# Patient Record
Sex: Male | Born: 1980 | Race: Black or African American | Hispanic: No | Marital: Single | Smoking: Current some day smoker
Health system: Southern US, Community
[De-identification: ages and names within clinical notes are randomized; demographics above are authoritative.]

## PROBLEM LIST (undated history)

## (undated) DIAGNOSIS — Z789 Other specified health status: Secondary | ICD-10-CM

## (undated) HISTORY — PX: ROTATOR CUFF REPAIR: SHX139

---

## 2018-01-02 ENCOUNTER — Encounter (HOSPITAL_COMMUNITY): Payer: Self-pay

## 2018-01-02 ENCOUNTER — Other Ambulatory Visit: Payer: Self-pay

## 2018-01-02 ENCOUNTER — Emergency Department (HOSPITAL_COMMUNITY)
Admission: EM | Admit: 2018-01-02 | Discharge: 2018-01-03 | Disposition: A | Discharge status: Home / Self Care | Payer: Non-veteran care | Attending: Emergency Medicine | Admitting: Emergency Medicine

## 2018-01-02 DIAGNOSIS — W268XXA Contact with other sharp object(s), not elsewhere classified, initial encounter: Secondary | ICD-10-CM | POA: Insufficient documentation

## 2018-01-02 DIAGNOSIS — Y990 Civilian activity done for income or pay: Secondary | ICD-10-CM | POA: Insufficient documentation

## 2018-01-02 DIAGNOSIS — S61012A Laceration without foreign body of left thumb without damage to nail, initial encounter: Secondary | ICD-10-CM | POA: Diagnosis present

## 2018-01-02 DIAGNOSIS — Y929 Unspecified place or not applicable: Secondary | ICD-10-CM | POA: Diagnosis not present

## 2018-01-02 DIAGNOSIS — F17200 Nicotine dependence, unspecified, uncomplicated: Secondary | ICD-10-CM | POA: Insufficient documentation

## 2018-01-02 DIAGNOSIS — Y9389 Activity, other specified: Secondary | ICD-10-CM | POA: Insufficient documentation

## 2018-01-02 HISTORY — DX: Other specified health status: Z78.9

## 2018-01-02 NOTE — ED Triage Notes (Signed)
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 This occurred last night.

## 2018-01-03 ENCOUNTER — Emergency Department (HOSPITAL_COMMUNITY): Payer: Non-veteran care

## 2018-01-03 MED ORDER — CEPHALEXIN 500 MG PO CAPS: 500.0000 mg | ORAL_CAPSULE | Freq: Four times a day (QID) | ORAL | 0 refills | Status: AC

## 2018-01-03 MED ORDER — LIDOCAINE HCL (PF) 1 % IJ SOLN
30.0000 mL | Freq: Once | INTRAMUSCULAR | Status: AC
  Administered 2018-01-03: 30 mL
  Filled 2018-01-03: qty 30

## 2018-01-03 NOTE — ED Provider Notes (Signed)
[facility] Provider Note   CSN: [id number] Arrival date & time: 01/02/18  2259     History   Chief Complaint Chief Complaint  Patient presents with  . Laceration    HPI Leon Hernandez is a 37 y.o. male with a hx of no known medical problems presents to the Emergency Department complaining of acute, persistent, laceration to the left thumb onset > 24 hours ago.  Pt reports he was at work when he cut his thumb on a piece of metal.  He reports clean the wound with alcohol and using direct pressure.  He states the bleeding decreased but did not cease.  He then applied Dermabond x2.  Patient reports that the wound has continued to ooze blood and clear fluid.  He denies numbness, tingling or weakness in the thumb.  No history of immunocompromise, diabetes or HIV.  No redness or purulent drainage.  Movement causes increased bleeding.  Direct pressure decreases the bleeding.   The history is provided by the patient and medical records. No language interpreter was used.    Past Medical History:  Diagnosis Date  . Known health problems: none     There are no active problems to display for this patient.   Past Surgical History:  Procedure Laterality Date  . ROTATOR CUFF REPAIR     right shoulder        Home Medications    Prior to Admission medications   Medication Sig Start Date End Date Taking? Authorizing Provider  cephALEXin (KEFLEX) 500 MG capsule Take 1 capsule (500 mg total) by mouth 4 (four) times daily. 01/03/18   Ammiel Guiney, Dahlia ClientHannah, PA-C    Family History History reviewed. No pertinent family history.  Social History Social History   Tobacco Use  . Smoking status: Current Some Day Smoker  . Smokeless tobacco: Never Used  Substance Use Topics  . Alcohol use: Yes    Comment: 2 beers/week  . Drug use: Never     Allergies   Patient has no known allergies.   Review of Systems Review of Systems  Constitutional:  Negative for fever.  Gastrointestinal: Negative for nausea and vomiting.  Skin: Positive for wound.  Allergic/Immunologic: Negative for immunocompromised state.  Neurological: Negative for weakness and numbness.  Hematological: Does not bruise/bleed easily.  Psychiatric/Behavioral: The patient is not nervous/anxious.      Physical Exam Updated Vital Signs BP (!) 148/93 (BP Location: Right Arm)   Pulse 65   Temp 98.2 F (36.8 C) (Oral)   Resp 18   Ht 5\' 9"  (1.753 m)   Wt 82.6 kg   SpO2 100%   BMI 26.88 kg/m   Physical Exam  Constitutional: He is oriented to person, place, and time. He appears well-developed and well-nourished. No distress.  HENT:  Head: Normocephalic and atraumatic.  Eyes: Conjunctivae are normal. No scleral icterus.  Neck: Normal range of motion.  Cardiovascular: Normal rate, regular rhythm, normal heart sounds and intact distal pulses.  No murmur heard. Capillary refill < 3 sec  Pulmonary/Chest: Effort normal and breath sounds normal. No respiratory distress.  Musculoskeletal: Normal range of motion. He exhibits no edema.  Full range of motion of the left thumb  Neurological: He is alert and oriented to person, place, and time.  Sensation: Intact to normal touch in the left thumb Strength: 5/5 with flexion and extension the left thumb  Skin: Skin is warm and dry. He is not diaphoretic.  2 cm laceration to  the lateral portion of the left thumb over the DIP.  No open joint.  Psychiatric: He has a normal mood and affect.  Nursing note and vitals reviewed.    ED Treatments / Results   Radiology Dg Finger Thumb Left  Result Date: 01/03/2018 CLINICAL DATA:  37 y/o  M; distal left thumb laceration. EXAM: LEFT THUMB 2+V COMPARISON:  None. FINDINGS: There is no evidence of fracture or dislocation. There is no evidence of arthropathy or other focal bone abnormality. No radiopaque foreign body. IMPRESSION: 1.  No acute fracture or dislocation identified. 2. No  radiopaque foreign body. Electronically Signed   By: Mitzi Hansen M.D.   On: 01/03/2018 00:18    Procedures Debridement Date/Time: 01/03/2018 3:26 AM Performed by: Dierdre Forth, PA-C Authorized by: Dierdre Forth, PA-C  Consent: Verbal consent obtained. Risks and benefits: risks, benefits and alternatives were discussed Consent given by: patient Required items: required blood products, implants, devices, and special equipment available Patient identity confirmed: verbally with patient Time out: Immediately prior to procedure a "time out" was called to verify the correct patient, procedure, equipment, support staff and site/side marked as required. Preparation: Patient was prepped and draped in the usual sterile fashion. Local anesthesia used: yes Anesthesia: nerve block  Anesthesia: Local anesthesia used: yes Local Anesthetic: lidocaine 1% without epinephrine Anesthetic total: 5 mL  Sedation: Patient sedated: no  Patient tolerance: Patient tolerated the procedure well with no immediate complications    (including critical care time)  Medications Ordered in ED Medications  lidocaine (PF) (XYLOCAINE) 1 % injection 30 mL (30 mLs Infiltration Given by Other 01/03/18 0231)     Initial Impression / Assessment and Plan / ED Course  I have reviewed the triage vital signs and the nursing notes.  Pertinent labs & imaging results that were available during my care of the patient were reviewed by me and considered in my medical decision making (see chart for details).     Laceration to the left thumb.  Tetanus is up-to-date.  Nerve block given for pain control and wound debrided along with removal of Dermabond.  Pressure dressing applied.  Granulation tissue is visible and wound appears to be healing well.  No signs of immediate infection.  Patient will be discharged home with Keflex.  Wound care given.  Patient will have wound check in 2 days.  He states  understanding and is in agreement with this plan.  Final Clinical Impressions(s) / ED Diagnoses   Final diagnoses:  Laceration of left thumb without foreign body without damage to nail, initial encounter    ED Discharge Orders         Ordered    cephALEXin (KEFLEX) 500 MG capsule  4 times daily     01/03/18 0328           Maesyn Frisinger, Dahlia Client, PA-C 01/03/18 0331    Zadie Rhine, MD 01/04/18 [phone]

## 2018-01-03 NOTE — ED Notes (Signed)
ED Provider at bedside administering lidocaine.  Wound soaking.

## 2018-01-03 NOTE — Discharge Instructions (Addendum)
1. Medications: Tylenol or ibuprofen for pain, usual home medications 2. Treatment: ice for swelling, keep wound clean with warm soap and water and keep bandage dry, do not submerge in water for 24 hours 3. Follow Up: Please return in 3 days for wound check. Return to the emergency department for increased redness, drainage of pus from the wound   WOUND CARE  Keep area clean and dry for 24 hours. Do not remove bandage, if applied.  After 24 hours, remove bandage and wash wound gently with mild soap and warm water. Reapply a new bandage after cleaning wound, if directed.   Continue daily cleansing with soap and water until healed. Return if you experience any of the following signs of infection: Swelling, redness, pus drainage, streaking, fever >101.0 F  Return if you experience excessive bleeding that does not stop after 15-20 minutes of constant, firm pressure.

## 2018-02-25 ENCOUNTER — Encounter (HOSPITAL_COMMUNITY): Payer: Self-pay | Admitting: Emergency Medicine

## 2018-02-25 ENCOUNTER — Emergency Department (HOSPITAL_COMMUNITY)
Admission: EM | Admit: 2018-02-25 | Discharge: 2018-02-26 | Disposition: A | Discharge status: Home / Self Care | Payer: No Typology Code available for payment source | Attending: Emergency Medicine | Admitting: Emergency Medicine

## 2018-02-25 ENCOUNTER — Other Ambulatory Visit: Payer: Self-pay

## 2018-02-25 DIAGNOSIS — Z79899 Other long term (current) drug therapy: Secondary | ICD-10-CM | POA: Insufficient documentation

## 2018-02-25 DIAGNOSIS — F17200 Nicotine dependence, unspecified, uncomplicated: Secondary | ICD-10-CM | POA: Insufficient documentation

## 2018-02-25 DIAGNOSIS — N50811 Right testicular pain: Secondary | ICD-10-CM | POA: Diagnosis present

## 2018-02-25 DIAGNOSIS — R52 Pain, unspecified: Secondary | ICD-10-CM

## 2018-02-25 DIAGNOSIS — N50819 Testicular pain, unspecified: Secondary | ICD-10-CM

## 2018-02-25 NOTE — ED Triage Notes (Signed)
Pt c/o "knot" on right testicle and pain x "a few days". Pain initially after sex on Saturday, when severe, radiates to lower abdomen. Pain worse with exertion. Denies nausea/vomiting/diarrhea/urinary symptoms.

## 2018-02-26 ENCOUNTER — Emergency Department (HOSPITAL_COMMUNITY): Payer: No Typology Code available for payment source

## 2018-02-26 LAB — URINALYSIS, ROUTINE W REFLEX MICROSCOPIC
BILIRUBIN URINE: NEGATIVE
Glucose, UA: NEGATIVE mg/dL
HGB URINE DIPSTICK: NEGATIVE
KETONES UR: NEGATIVE mg/dL
Leukocytes, UA: NEGATIVE
Nitrite: NEGATIVE
PROTEIN: NEGATIVE mg/dL
Specific Gravity, Urine: 1.018 (ref 1.005–1.030)
pH: 5 (ref 5.0–8.0)

## 2018-02-26 MED ORDER — IBUPROFEN 800 MG PO TABS
800.0000 mg | ORAL_TABLET | Freq: Once | ORAL | Status: AC
  Administered 2018-02-26: 800 mg via ORAL
  Filled 2018-02-26: qty 1

## 2018-02-26 MED ORDER — IBUPROFEN 600 MG PO TABS: 600.0000 mg | ORAL_TABLET | Freq: Four times a day (QID) | ORAL | 0 refills | Status: DC | PRN

## 2018-02-26 NOTE — ED Notes (Signed)
Pt is aware he needs a urine sample. Pt has urinal at bedside.

## 2018-02-26 NOTE — ED Provider Notes (Signed)
[facility] EMERGENCY DEPARTMENT Provider Note   CSN: [id number] Arrival date & time: 02/25/18  2323     History   Chief Complaint Chief Complaint  Patient presents with  . Testicle Pain    HPI Leon Hernandez is a 37 y.o. male.  Patient reports "knot" to his right testicle for the past several weeks.  Denies any trauma or falls.  This has not been bothering him until this past weekend when he started having pain after sex on Saturday, 3 days ago.  He now has severe pain in his right testicle that radiates to his abdomen.  It comes and goes but never goes away completely and waxes and wanes in severity.  He does not take anything for it at home.  Denies any pain with urination or blood in the urine.  Denies any penile discharge.  Denies any fevers, diarrhea, nausea or vomiting.  No previous abdominal surgeries. Sexually monogamous. No history of kidney stones. The pain is worse with palpation and movement.  The history is provided by the patient.  Testicle Pain  Associated symptoms include abdominal pain. Pertinent negatives include no chest pain, no headaches and no shortness of breath.    Past Medical History:  Diagnosis Date  . Known health problems: none     There are no active problems to display for this patient.   Past Surgical History:  Procedure Laterality Date  . ROTATOR CUFF REPAIR     right shoulder        Home Medications    Prior to Admission medications   Medication Sig Start Date End Date Taking? Authorizing Provider  cephALEXin (KEFLEX) 500 MG capsule Take 1 capsule (500 mg total) by mouth 4 (four) times daily. 01/03/18   Muthersbaugh, Dahlia Client, PA-C    Family History No family history on file.  Social History Social History   Tobacco Use  . Smoking status: Current Some Day Smoker  . Smokeless tobacco: Never Used  Substance Use Topics  . Alcohol use: Yes    Comment: 2 beers/week  . Drug use: Never     Allergies     Patient has no known allergies.   Review of Systems Review of Systems  Constitutional: Negative for activity change, appetite change and fever.  HENT: Negative for congestion, nosebleeds and rhinorrhea.   Eyes: Negative for visual disturbance.  Respiratory: Negative for cough, chest tightness and shortness of breath.   Cardiovascular: Negative for chest pain.  Gastrointestinal: Positive for abdominal pain. Negative for nausea and vomiting.  Genitourinary: Positive for testicular pain. Negative for dysuria and hematuria.  Musculoskeletal: Negative for arthralgias and myalgias.  Skin: Negative for rash.  Neurological: Negative for dizziness, weakness and headaches.   all other systems are negative except as noted in the HPI and PMH.     Physical Exam Updated Vital Signs BP 138/82 (BP Location: Right Arm)   Pulse 66   Temp 98.3 F (36.8 C) (Oral)   Resp 16   SpO2 100%   Physical Exam  Constitutional: He is oriented to person, place, and time. He appears well-developed and well-nourished. No distress.  HENT:  Head: Normocephalic and atraumatic.  Mouth/Throat: Oropharynx is clear and moist. No oropharyngeal exudate.  Eyes: Pupils are equal, round, and reactive to light. Conjunctivae and EOM are normal.  Neck: Normal range of motion. Neck supple.  No meningismus.  Cardiovascular: Normal rate, regular rhythm, normal heart sounds and intact distal pulses.  No murmur heard. Pulmonary/Chest: Effort  normal and breath sounds normal. No respiratory distress.  Abdominal: Soft. There is no tenderness. There is no rebound and no guarding.  Genitourinary:  Genitourinary Comments: Circumcised penis, no discharge. Piece of residual foreskin adhered to glans, chronic per patient No erythema or fluctuance along scrotum.  Right testicle is diffusely tender.  There is hard nodule at superior aspect of testicle that is the majority of patient's pain.  Lie is normal.  Musculoskeletal: Normal  range of motion. He exhibits no edema or tenderness.  Neurological: He is alert and oriented to person, place, and time. No cranial nerve deficit. He exhibits normal muscle tone. Coordination normal.   5/5 strength throughout. CN 2-12 intact.Equal grip strength.   Skin: Skin is warm.  Psychiatric: He has a normal mood and affect. His behavior is normal.  Nursing note and vitals reviewed.    ED Treatments / Results  Labs (all labs ordered are listed, but only abnormal results are displayed) Labs Reviewed  URINALYSIS, ROUTINE W REFLEX MICROSCOPIC  GC/CHLAMYDIA PROBE AMP ([place]) NOT AT [facility]    EKG None  Radiology US Scrotum W/doppler  Result Date: 02/26/2018 CLINICAL DATA:  Right testicular pain. EXAM: SCROTAL ULTRASOUND DOPPLER ULTRASOUND OF THE TESTICLES TECHNIQUE: Complete ultrasound examination of the testicles, epididymis, and other scrotal structures was performed. Color and spectral Doppler ultrasound were also utilized to evaluate blood flow to the testicles. COMPARISON:  None. FINDINGS: Right testicle Measurements: 3.0 x 2.6 x 3.1 cm. Homogeneous echogenicity with normal blood flow. Small 4 mm tunic albuginea cyst, incidental. No suspicious mass or microlithiasis visualized. Left testicle Measurements: 3.0 x 2.6 x 3.0 cm. Homogeneous echogenicity with normal blood flow. No mass or microlithiasis visualized. Right epididymis:  Normal in size and appearance.  No hyperemia. Left epididymis: Epididymal head cyst measuring 15 mm. No hyperemia. Hydrocele:  Small bilaterally. Varicocele:  None visualized. Pulsed Doppler interrogation of both testes demonstrates normal low resistance arterial and venous waveforms bilaterally. IMPRESSION: 1. No testicular torsion or suspicious mass. 2. A small 4 mm right tunic albuginea cyst is considered incidental. No dedicated imaging follow-up is needed. 3. Left epididymal head cyst. 4. Small bilateral hydroceles. Electronically Signed   By: Narda Rutherford M.D.   On: 02/26/2018 03:07    Procedures Procedures (including critical care time)  Medications Ordered in ED Medications  ibuprofen (ADVIL,MOTRIN) tablet 800 mg (has no administration in time range)     Initial Impression / Assessment and Plan / ED Course  I have reviewed the triage vital signs and the nursing notes.  Pertinent labs & imaging results that were available during my care of the patient were reviewed by me and considered in my medical decision making (see chart for details).    3 days of testicular pain that radiates to abdomen. No urinary symptoms or fever.   Urinalysis is negative. Ultrasound shows no evidence of testicular torsion.  There is a right sided tunica albuginea cyst which is likely the cause of patient's pain.  We will trial anti-inflammatories, scrotal support, urology follow-up.  Return precautions discussed.  Final Clinical Impressions(s) / ED Diagnoses   Final diagnoses:  Testicular pain  Right testicular pain    ED Discharge Orders    None       Joseangel Nettleton, Jeannett Senior, MD 02/26/18 [phone]

## 2018-02-26 NOTE — Discharge Instructions (Signed)
The blood flow to your testicle is normal on ultrasound today.  Follow-up with the urologist regarding the cyst on your testicle.  You may wear tight fitting underwear to help with scrotal support as well as take the anti-inflammatory medication.  Return to the ED with worsening pain, not able to urinate, fever, vomiting or other concerns.

## 2018-02-26 NOTE — ED Notes (Signed)
Patient transported to Ultrasound 

## 2018-02-26 NOTE — ED Notes (Signed)
Patient verbalizes understanding of medications and discharge instructions. No further questions at this time. VSS and patient ambulatory at discharge.   

## 2018-06-23 ENCOUNTER — Other Ambulatory Visit: Payer: Self-pay

## 2018-06-23 ENCOUNTER — Emergency Department (HOSPITAL_COMMUNITY)
Admission: EM | Admit: 2018-06-23 | Discharge: 2018-06-24 | Disposition: A | Discharge status: Home / Self Care | Payer: No Typology Code available for payment source | Attending: Emergency Medicine | Admitting: Emergency Medicine

## 2018-06-23 ENCOUNTER — Emergency Department (HOSPITAL_COMMUNITY): Payer: No Typology Code available for payment source

## 2018-06-23 DIAGNOSIS — Z79899 Other long term (current) drug therapy: Secondary | ICD-10-CM | POA: Insufficient documentation

## 2018-06-23 DIAGNOSIS — M79641 Pain in right hand: Secondary | ICD-10-CM | POA: Diagnosis not present

## 2018-06-23 DIAGNOSIS — F17210 Nicotine dependence, cigarettes, uncomplicated: Secondary | ICD-10-CM | POA: Diagnosis not present

## 2018-06-23 NOTE — ED Triage Notes (Signed)
Pt woke up this morning with swelling and pain to right hand. Pt states that he did not injure it in anyway that he knows of. Feels a "pulling" sensation whenever he tries to straighten his fingers. States that the pain originates in his palm and radiates to ring finger.

## 2018-06-24 MED ORDER — HYDROCODONE-ACETAMINOPHEN 5-325 MG PO TABS: 2.0000 | ORAL_TABLET | Freq: Four times a day (QID) | ORAL | 0 refills | Status: AC | PRN

## 2018-06-24 MED ORDER — HYDROCODONE-ACETAMINOPHEN 5-325 MG PO TABS: 2.0000 | ORAL_TABLET | ORAL | 0 refills | Status: DC | PRN

## 2018-06-24 MED ORDER — IBUPROFEN 800 MG PO TABS: 800.0000 mg | ORAL_TABLET | Freq: Three times a day (TID) | ORAL | 0 refills | Status: AC

## 2018-06-24 MED ORDER — HYDROCODONE-ACETAMINOPHEN 5-325 MG PO TABS
2.0000 | ORAL_TABLET | Freq: Once | ORAL | Status: AC
  Administered 2018-06-24: 2 via ORAL
  Filled 2018-06-24: qty 2

## 2018-06-24 NOTE — ED Provider Notes (Signed)
[facility] EMERGENCY DEPARTMENT Provider Note   CSN: [id number] Arrival date & time: 06/23/18  2110    History   Chief Complaint Chief Complaint  Patient presents with  . right hand pain    HPI Leon Hernandez is a 38 y.o. male.     Patient presents to the emergency department with a chief complaint of right hand pain.  Patient states that when he awoke yesterday morning his right hand was hurting.  He reports mild swelling.  Denies any known injury.  Denies any fever, redness, insect bites.  He has tried ice and heat with little relief.  Denies numbness or tingling.  States that the pain radiates from base of his right ring finger into his hand and fingertips.  The history is provided by the patient. No language interpreter was used.    Past Medical History:  Diagnosis Date  . Known health problems: none     There are no active problems to display for this patient.   Past Surgical History:  Procedure Laterality Date  . ROTATOR CUFF REPAIR     right shoulder        Home Medications    Prior to Admission medications   Medication Sig Start Date End Date Taking? Authorizing Provider  cephALEXin (KEFLEX) 500 MG capsule Take 1 capsule (500 mg total) by mouth 4 (four) times daily. Patient not taking: Reported on 02/26/2018 01/03/18   Muthersbaugh, Dahlia Client, PA-C  HYDROcodone-acetaminophen (NORCO/VICODIN) 5-325 MG tablet Take 2 tablets by mouth every 6 (six) hours as needed. 06/24/18   Roxy Horseman, PA-C  ibuprofen (ADVIL,MOTRIN) 800 MG tablet Take 1 tablet (800 mg total) by mouth 3 (three) times daily. 06/24/18   Roxy Horseman, PA-C  mirtazapine (REMERON) 15 MG tablet Take 15 mg by mouth at bedtime.    [provider]    Family History No family history on file.  Social History Social History   Tobacco Use  . Smoking status: Current Some Day Smoker  . Smokeless tobacco: Never Used  Substance Use Topics  . Alcohol use: Yes   Comment: 2 beers/week  . Drug use: Never     Allergies   Patient has no known allergies.   Review of Systems Review of Systems  All other systems reviewed and are negative.    Physical Exam Updated Vital Signs BP 134/83 (BP Location: Left Arm)   Pulse 69   Temp 98.2 F (36.8 C) (Oral)   Resp 16   Ht 5\' 9"  (1.753 m)   Wt 83.9 kg   SpO2 99%   BMI 27.32 kg/m   Physical Exam Vitals signs and nursing note reviewed.  Constitutional:      General: He is not in acute distress.    Appearance: He is not diaphoretic.  HENT:     Head: Normocephalic and atraumatic.  Eyes:     Conjunctiva/sclera: Conjunctivae normal.     Pupils: Pupils are equal, round, and reactive to light.  Neck:     Trachea: No tracheal deviation.  Cardiovascular:     Rate and Rhythm: Normal rate.  Pulmonary:     Effort: Pulmonary effort is normal. No respiratory distress.  Abdominal:     Palpations: Abdomen is soft.  Musculoskeletal: Normal range of motion.     Comments: Range of motion and strength of right hand and wrist is 5/5 there is tenderness to palpation at the base of the right ring finger, mild swelling noted  Skin:  General: Skin is warm and dry.     Comments: No erythema, no excessive warmth, no evidence of infection or abscess  Neurological:     Mental Status: He is alert and oriented to person, place, and time.  Psychiatric:        Judgment: Judgment normal.      ED Treatments / Results  Labs (all labs ordered are listed, but only abnormal results are displayed) Labs Reviewed - No data to display  EKG None  Radiology Dg Hand 2 View Right  Result Date: 06/23/2018 CLINICAL DATA:  38 year old male who woke with right hand swelling from the metacarpals to the distal fingers. EXAM: RIGHT HAND - 2 VIEW COMPARISON:  None. FINDINGS: Bone mineralization is within normal limits. There is no evidence of fracture or dislocation. Tiny chronic appearing ossific fragments along the radial  aspect of the 3rd PIP joint. There is also a small area of indistinct density or dystrophic soft tissue calcification along the radial aspect of the 4th MCP. However, the joint spaces are preserved. Generalized appearing soft tissue swelling. No soft tissue gas. No radiopaque foreign body identified. IMPRESSION: 1. Soft tissue swelling.  No soft tissue gas. 2. No acute osseous abnormality identified. Small chronic and/or dystrophic appearing calcifications adjacent to the 3rd PIP and 4th MCPs. Electronically Signed   By: Odessa Fleming M.D.   On: 06/23/2018 21:47    Procedures Procedures (including critical care time)  Medications Ordered in ED Medications  HYDROcodone-acetaminophen (NORCO/VICODIN) 5-325 MG per tablet 2 tablet (has no administration in time range)     Initial Impression / Assessment and Plan / ED Course  I have reviewed the triage vital signs and the nursing notes.  Pertinent labs & imaging results that were available during my care of the patient were reviewed by me and considered in my medical decision making (see chart for details).       Patient with right hand pain.  Awoke with mildly swollen and.  No fevers.  No redness or sign of infection.  He is acutely tender at the base of his right ring finger.  X-ray shows calcification along the radial aspect of the fourth MCP.  I will give the patient a splint and recommend that he follow-up with a hand specialist.  Return precautions given.  Final Clinical Impressions(s) / ED Diagnoses   Final diagnoses:  Pain of right hand    ED Discharge Orders         Ordered    HYDROcodone-acetaminophen (NORCO/VICODIN) 5-325 MG tablet  Every 4 hours PRN,   Status:  Discontinued     06/24/18 0416    ibuprofen (ADVIL,MOTRIN) 800 MG tablet  3 times daily     06/24/18 0416    HYDROcodone-acetaminophen (NORCO/VICODIN) 5-325 MG tablet  Every 6 hours PRN     06/24/18 0416           Roxy Horseman, PA-C 06/24/18 0422    Ward,  Layla Maw, DO 06/24/18 [phone]

## 2018-06-24 NOTE — Discharge Instructions (Addendum)
Return for fever, swelling of the wrist, or if your hand turns red.

## 2020-01-29 IMAGING — DX DG HAND 2V*R*
2 series · 2 of 2 positions shown · non-contrast
Comparison: None.

CLINICAL DATA: 37-year-old male who woke with right hand swelling
from the metacarpals to the distal fingers.

EXAM:
RIGHT HAND - 2 VIEW

[hand pa]
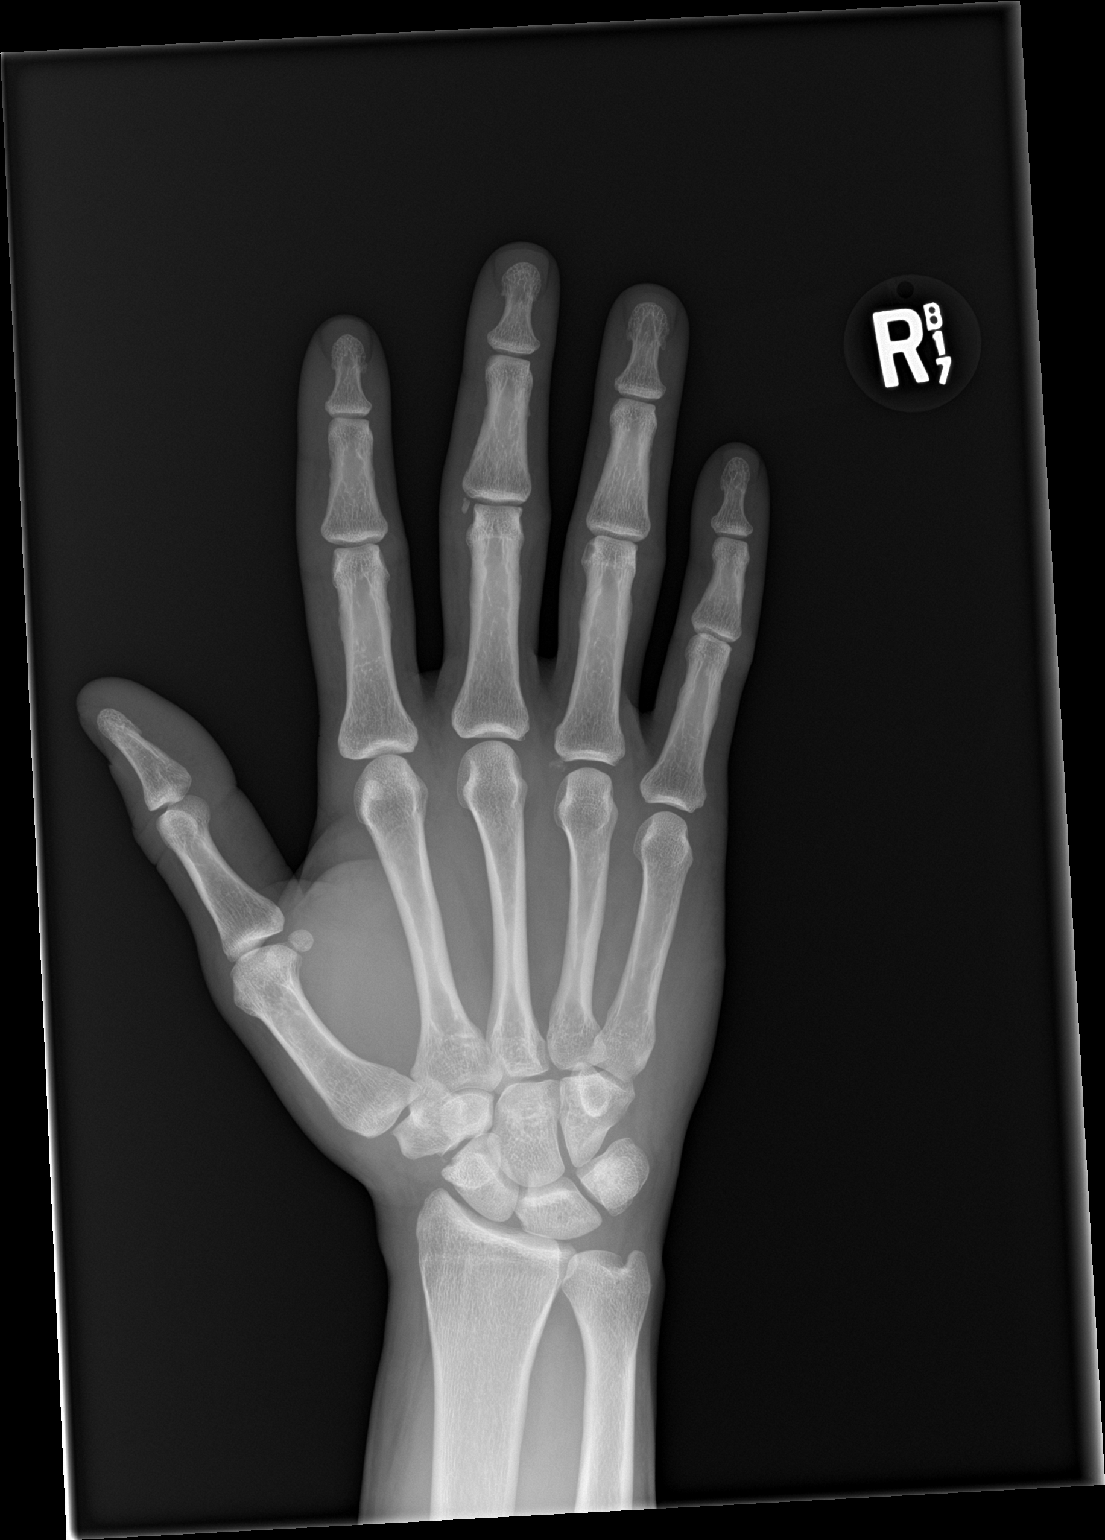

[hand lat]
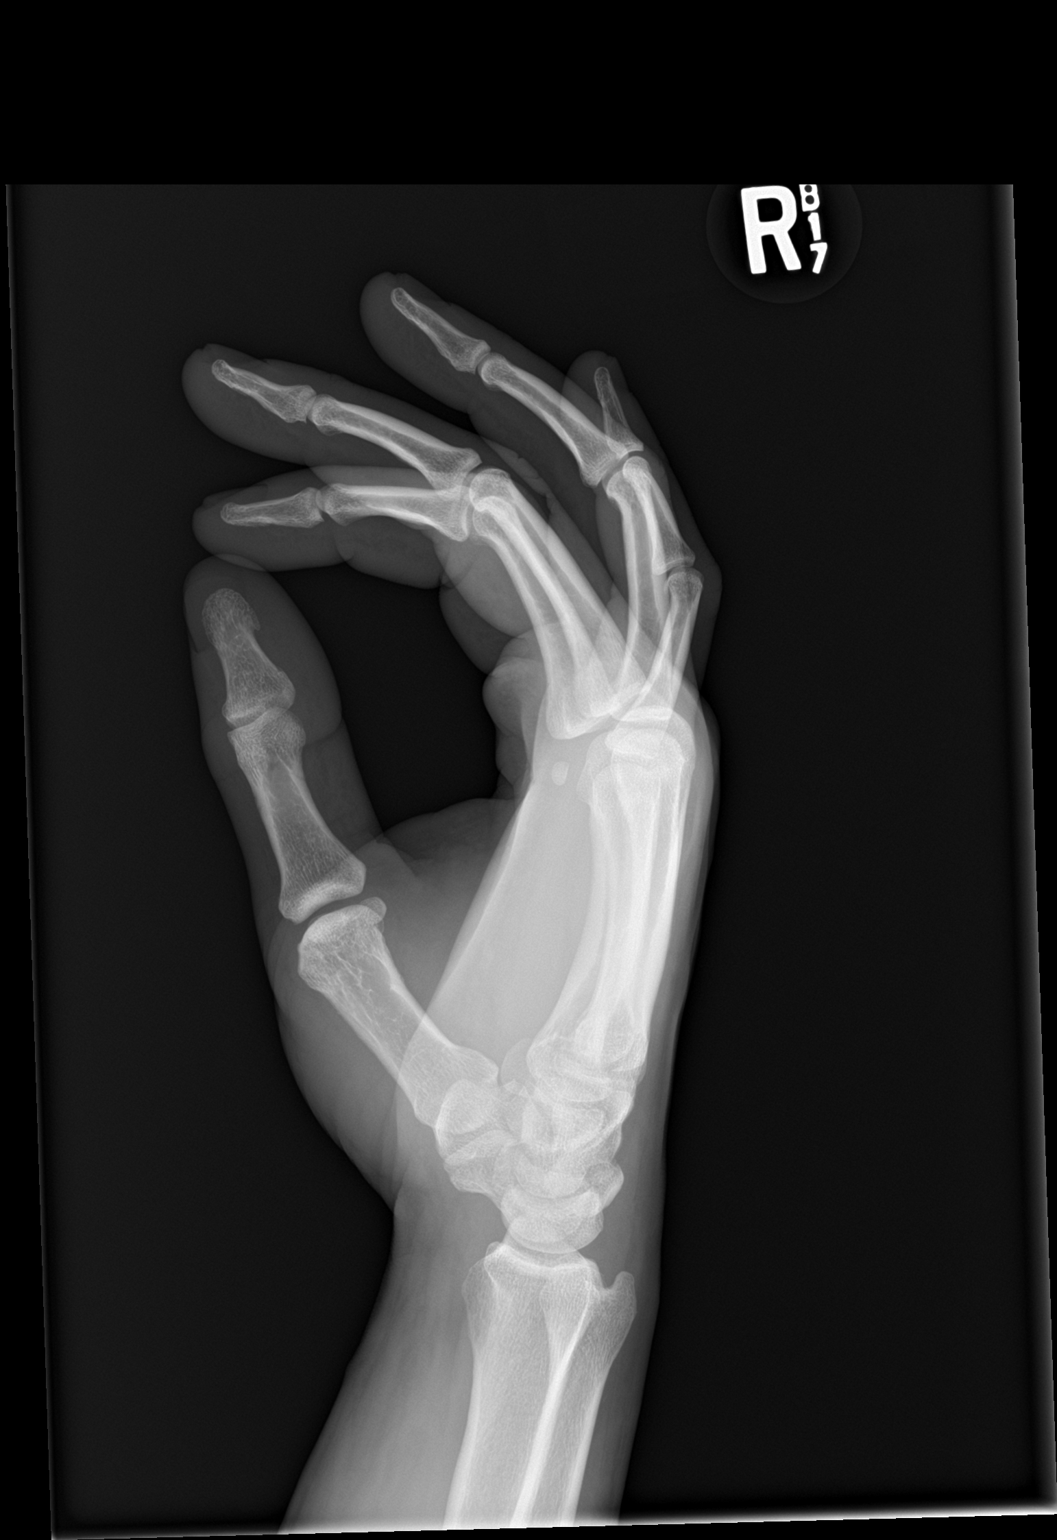

[2 of 2 positions shown; findings below may reference images not displayed]

FINDINGS: Bone mineralization is within normal limits. There is no evidence of
fracture or dislocation. Tiny chronic appearing ossific fragments
along the radial aspect of the 3rd PIP joint. There is also a small
area of indistinct density or dystrophic soft tissue calcification
along the radial aspect of the 4th MCP. However, the joint spaces
are preserved.

Generalized appearing soft tissue swelling. No soft tissue gas. No
radiopaque foreign body identified.
IMPRESSION: 1. Soft tissue swelling.  No soft tissue gas.
2. No acute osseous abnormality identified. Small chronic and/or
dystrophic appearing calcifications adjacent to the 3rd PIP and 4th
MCPs.

## 2024-02-01 ENCOUNTER — Other Ambulatory Visit: Payer: Self-pay

## 2024-02-01 ENCOUNTER — Emergency Department (HOSPITAL_BASED_OUTPATIENT_CLINIC_OR_DEPARTMENT_OTHER)
Admission: EM | Admit: 2024-02-01 | Discharge: 2024-02-02 | Disposition: A | Discharge status: Home / Self Care | Attending: Emergency Medicine | Admitting: Emergency Medicine

## 2024-02-01 DIAGNOSIS — J00 Acute nasopharyngitis [common cold]: Secondary | ICD-10-CM | POA: Diagnosis not present

## 2024-02-01 DIAGNOSIS — R059 Cough, unspecified: Secondary | ICD-10-CM | POA: Diagnosis present

## 2024-02-01 LAB — RESP PANEL BY RT-PCR (RSV, FLU A&B, COVID)  RVPGX2
Influenza A by PCR: NEGATIVE
Influenza B by PCR: NEGATIVE
Resp Syncytial Virus by PCR: NEGATIVE
SARS Coronavirus 2 by RT PCR: NEGATIVE

## 2024-02-01 NOTE — ED Triage Notes (Signed)
 Pt POV reporting cold sx since Wed, denies SOB.

## 2024-02-02 NOTE — ED Provider Notes (Addendum)
 [place] EMERGENCY DEPARTMENT AT [facility] Provider Note  CSN: [id number] Arrival date & time: 02/01/24 2143  Chief Complaint(s) URI  HPI Leon Hernandez is a 43 y.o. male here with 2 to 3 days of nasal congestion, cough.  No shortness of breath.  No chest pain.  He endorses subjective fevers.  He has been taking over-the-counter medication sudafed, dayquil and nyquil.  The history is provided by the patient.    Past Medical History Past Medical History:  Diagnosis Date   Known health problems: none    There are no active problems to display for this patient.  Home Medication(s) Prior to Admission medications   Medication Sig Start Date End Date Taking? Authorizing Provider  cephALEXin  (KEFLEX ) 500 MG capsule Take 1 capsule (500 mg total) by mouth 4 (four) times daily. Patient not taking: Reported on 02/26/2018 01/03/18   Muthersbaugh, Chiquita, PA-C  HYDROcodone -acetaminophen  (NORCO/VICODIN) 5-325 MG tablet Take 2 tablets by mouth every 6 (six) hours as needed. 06/24/18   Vicky Charleston, PA-C  ibuprofen  (ADVIL ,MOTRIN ) 800 MG tablet Take 1 tablet (800 mg total) by mouth 3 (three) times daily. 06/24/18   Vicky Charleston, PA-C  mirtazapine (REMERON) 15 MG tablet Take 15 mg by mouth at bedtime.    [provider]                                                                                                                                    Allergies Patient has no known allergies.  Review of Systems Review of Systems As noted in HPI  Physical Exam Vital Signs  I have reviewed the triage vital signs BP (!) 152/98 (BP Location: Right Arm)   Pulse 99   Temp 98.9 F (37.2 C) (Oral)   Resp 17   Ht 5' 9 (1.753 m)   Wt 86.2 kg   SpO2 97%   BMI 28.06 kg/m   Physical Exam Vitals reviewed.  Constitutional:      General: He is not in acute distress.    Appearance: He is well-developed. He is not diaphoretic.  HENT:     Head: Normocephalic and  atraumatic.     Right Ear: Tympanic membrane normal.     Left Ear: Tympanic membrane normal.     Nose: Mucosal edema, congestion and rhinorrhea present.     Mouth/Throat:     Pharynx: Postnasal drip present. No oropharyngeal exudate or posterior oropharyngeal erythema.  Eyes:     General: No scleral icterus.       Right eye: No discharge.        Left eye: No discharge.     Conjunctiva/sclera: Conjunctivae normal.     Pupils: Pupils are equal, round, and reactive to light.  Cardiovascular:     Rate and Rhythm: Normal rate and regular rhythm.     Heart sounds: No murmur heard.    No friction rub. No gallop.  Pulmonary:  Effort: Pulmonary effort is normal. No respiratory distress.     Breath sounds: Normal breath sounds. No stridor. No rales.  Abdominal:     General: There is no distension.     Palpations: Abdomen is soft.     Tenderness: There is no abdominal tenderness.  Musculoskeletal:        General: No tenderness.     Cervical back: Normal range of motion and neck supple.  Skin:    General: Skin is warm and dry.     Findings: No erythema or rash.  Neurological:     Mental Status: He is alert and oriented to person, place, and time.     ED Results and Treatments Labs (all labs ordered are listed, but only abnormal results are displayed) Labs Reviewed  RESP PANEL BY RT-PCR (RSV, FLU A&B, COVID)  [id number]                                                                                                                         EKG  EKG Interpretation Date/Time:    Ventricular Rate:    PR Interval:    QRS Duration:    QT Interval:    QTC Calculation:   R Axis:      Text Interpretation:         Radiology No results found.  Medications Ordered in ED Medications - No data to display Procedures Procedures  (including critical care time) Medical Decision Making / ED Course   Medical Decision Making Amount and/or Complexity of Data Reviewed Labs: ordered.  Decision-making details documented in ED Course.    Patient presents with viral symptoms for 3 days. Adequate oral hydration. Rest of history as above.  Patient appears well. No signs of toxicity, patient is interactive. No hypoxia, tachypnea or other signs of respiratory distress. No sign of clinical dehydration. Lung exam clear. Rest of exam as above.  No evidence suggestive of pharyngitis, AOM, PNA, or meningitis.  Chest x-ray not indicated at this time.  Most consistent with viral illness.  Viral panel to assess for COVID, influenza, RSV was negative.  Discussed symptomatic treatment with the patient and they will follow closely with their PCP.      Final Clinical Impression(s) / ED Diagnoses Final diagnoses:  Acute nasopharyngitis   The patient appears reasonably screened and/or stabilized for discharge and I doubt any other medical condition or other [facility] requiring further screening, evaluation, or treatment in the ED at this time. I have discussed the findings, Dx and Tx plan with the patient/family who expressed understanding and agree(s) with the plan. Discharge instructions discussed at length. The patient/family was given strict return precautions who verbalized understanding of the instructions. No further questions at time of discharge.  Disposition: Discharge  Condition: Good  ED Discharge Orders     None         Follow Up: Clinic, [facility] [address] [place] [address] [phone]  Call  as needed  This chart was dictated using voice recognition software.  Despite best efforts to proofread,  errors can occur which can change the documentation meaning.      Trine Raynell Moder, MD 02/02/24 [phone]

## 2024-02-02 NOTE — Discharge Instructions (Addendum)
 You may take over-the-counter medicine for symptomatic relief, such as Tylenol, Motrin, TheraFlu, Alka seltzer , black elderberry, etc. Please limit acetaminophen (Tylenol) to 4000 mg and Ibuprofen (Motrin, Advil, etc.) to 2400 mg for a 24hr period. Please note that other over-the-counter medicine may contain acetaminophen or ibuprofen as a component of their ingredients.

## 2024-04-23 ENCOUNTER — Emergency Department (HOSPITAL_BASED_OUTPATIENT_CLINIC_OR_DEPARTMENT_OTHER)
Admission: EM | Admit: 2024-04-23 | Discharge: 2024-04-23 | Disposition: A | Discharge status: Home / Self Care | Attending: Emergency Medicine | Admitting: Emergency Medicine

## 2024-04-23 ENCOUNTER — Other Ambulatory Visit: Payer: Self-pay

## 2024-04-23 ENCOUNTER — Encounter (HOSPITAL_BASED_OUTPATIENT_CLINIC_OR_DEPARTMENT_OTHER): Payer: Self-pay

## 2024-04-23 ENCOUNTER — Emergency Department (HOSPITAL_BASED_OUTPATIENT_CLINIC_OR_DEPARTMENT_OTHER)

## 2024-04-23 DIAGNOSIS — S6991XA Unspecified injury of right wrist, hand and finger(s), initial encounter: Secondary | ICD-10-CM | POA: Insufficient documentation

## 2024-04-23 DIAGNOSIS — X500XXA Overexertion from strenuous movement or load, initial encounter: Secondary | ICD-10-CM | POA: Diagnosis not present

## 2024-04-23 NOTE — ED Provider Notes (Signed)
 " [place] EMERGENCY DEPARTMENT AT [facility] Provider Note   CSN: [id number] Arrival date & time: 04/23/24  1646     Patient presents with: Finger Injury (R middle)   Leon Hernandez is a 43 y.o. male.   Patient to ED with right middle finger injury. 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 No open or bleeding wound. He is right hand dominant.   The history is provided by the patient. No language interpreter was used.       Prior to Admission medications  Medication Sig Start Date End Date Taking? Authorizing Provider  cephALEXin  (KEFLEX ) 500 MG capsule Take 1 capsule (500 mg total) by mouth 4 (four) times daily. Patient not taking: Reported on 02/26/2018 01/03/18   Muthersbaugh, Chiquita, PA-C  HYDROcodone -acetaminophen  (NORCO/VICODIN) 5-325 MG tablet Take 2 tablets by mouth every 6 (six) hours as needed. 06/24/18   Vicky Charleston, PA-C  ibuprofen  (ADVIL ,MOTRIN ) 800 MG tablet Take 1 tablet (800 mg total) by mouth 3 (three) times daily. 06/24/18   Vicky Charleston, PA-C  mirtazapine (REMERON) 15 MG tablet Take 15 mg by mouth at bedtime.    [provider]    Allergies: Patient has no known allergies.    Review of Systems  Updated Vital Signs BP (!) 143/99 (BP Location: Left Arm)   Pulse 80   Temp 98.2 F (36.8 C)   Resp 17   SpO2 100%   Physical Exam Vitals and nursing note reviewed.  Constitutional:      Appearance: He is well-developed.  Pulmonary:     Effort: Pulmonary effort is normal.  Musculoskeletal:        General: Normal range of motion.     Cervical back: Normal range of motion.     Comments: Right middle finger is minimally swollen over the DIP joint. Nail is intact without subungual hematoma. No deformity. Normal sensation. NOrmal capillary refill.   Skin:    General: Skin is warm and dry.   Neurological:     Mental Status: He is alert and oriented to person, place, and time.     (all labs ordered are listed, but only abnormal results are displayed) Labs Reviewed - No data to display  EKG: None  Radiology: DG Finger Middle Right Result Date: 04/23/2024 CLINICAL DATA:  Trauma to the right middle finger. EXAM: RIGHT MIDDLE FINGER 2+V COMPARISON:  Right hand radiograph dated 06/23/2018. FINDINGS: Minimally displaced tiny avulsion fracture of the ulnar base of the distal phalanx of the middle finger. No other acute fracture. The bones are well mineralized. No arthritic changes. Mild soft tissue swelling of the distal middle finger. No radiopaque foreign object or soft tissue gas. IMPRESSION: Avulsion fracture of the base of the distal phalanx of the middle finger. Electronically Signed   By: Vanetta Chou M.D.   On: 04/23/2024 17:42     Procedures   Medications Ordered in the ED - No data to display  Clinical Course as of 04/23/24 1820  Thu Apr 23, 2024  1815 Imaging of the right middle finger shows a small avulsion fracture at the ulnar base of the distal phalanx. Finger placed in an aluminum foam splint. Will refer to hand ortho for recheck. Discussed anti-inflammatory medications for pain and he advises he has ibuprofen  and naproxen at home. Counseled on appropriate dose and usage.  [SU]  Clinical Course User Index [SU] Odell Balls, PA-C                                 Medical Decision Making Amount and/or Complexity of Data Reviewed Radiology: ordered.        Final diagnoses:  Injury of finger of right hand, initial encounter    ED Discharge Orders     None          Odell Balls, PA-C 04/23/24 1820    Pamella Ozell LABOR, DO 04/30/24 1251  "

## 2024-04-23 NOTE — Discharge Instructions (Signed)
 As we discussed, follow up with hand orthopedics, Dr. Delene, for recheck of right finger injury. Wear the splint for comfort and protection. Take Naproxen or Motrin  as discussed.

## 2024-04-23 NOTE — ED Triage Notes (Signed)
 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 +ROM, but it makes the pain way worse.
